# Patient Record
Sex: Male | Born: 1985 | Race: Black or African American | Hispanic: No | Marital: Single | Smoking: Never smoker
Health system: Southern US, Community
[De-identification: ages and names within clinical notes are randomized; demographics above are authoritative.]

---
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
## 2010-06-24 ENCOUNTER — Emergency Department (HOSPITAL_BASED_OUTPATIENT_CLINIC_OR_DEPARTMENT_OTHER)
Admission: EM | Admit: 2010-06-24 | Discharge: 2010-06-24 | Disposition: A | Discharge status: Home / Self Care | Payer: Self-pay | Attending: Emergency Medicine | Admitting: Emergency Medicine

## 2010-06-24 ENCOUNTER — Emergency Department (INDEPENDENT_AMBULATORY_CARE_PROVIDER_SITE_OTHER): Payer: Self-pay

## 2010-06-24 DIAGNOSIS — N1330 Unspecified hydronephrosis: Secondary | ICD-10-CM

## 2010-06-24 DIAGNOSIS — N201 Calculus of ureter: Secondary | ICD-10-CM | POA: Insufficient documentation

## 2010-06-24 DIAGNOSIS — R109 Unspecified abdominal pain: Secondary | ICD-10-CM | POA: Insufficient documentation

## 2010-06-24 LAB — URINALYSIS, ROUTINE W REFLEX MICROSCOPIC
Bilirubin Urine: NEGATIVE
Nitrite: NEGATIVE

## 2010-06-24 LAB — BASIC METABOLIC PANEL
BUN: 11 mg/dL (ref 6–23)
CO2: 25 mEq/L (ref 19–32)
Chloride: 103 mEq/L (ref 96–112)
Creatinine, Ser: 1 mg/dL (ref 0.4–1.5)
GFR calc Af Amer: >60 mL/min (ref >60)
Glucose, Bld: 89 mg/dL (ref 70–99)

## 2010-06-24 LAB — URINE MICROSCOPIC-ADD ON

## 2010-09-21 ENCOUNTER — Emergency Department (HOSPITAL_COMMUNITY)
Admission: EM | Admit: 2010-09-21 | Discharge: 2010-09-22 | Disposition: A | Discharge status: Home / Self Care | Payer: Self-pay | Attending: Emergency Medicine | Admitting: Emergency Medicine

## 2010-09-21 ENCOUNTER — Emergency Department (HOSPITAL_COMMUNITY): Payer: Self-pay

## 2010-09-21 DIAGNOSIS — W2209XA Striking against other stationary object, initial encounter: Secondary | ICD-10-CM | POA: Insufficient documentation

## 2010-09-21 DIAGNOSIS — S62319A Displaced fracture of base of unspecified metacarpal bone, initial encounter for closed fracture: Secondary | ICD-10-CM | POA: Insufficient documentation

## 2010-10-17 ENCOUNTER — Inpatient Hospital Stay (INDEPENDENT_AMBULATORY_CARE_PROVIDER_SITE_OTHER)
Admission: RE | Admit: 2010-10-17 | Discharge: 2010-10-17 | Disposition: A | Discharge status: Home / Self Care | Payer: Self-pay | Source: Ambulatory Visit | Attending: Family Medicine | Admitting: Family Medicine

## 2010-10-17 DIAGNOSIS — S62309A Unspecified fracture of unspecified metacarpal bone, initial encounter for closed fracture: Secondary | ICD-10-CM

## 2011-07-16 DIAGNOSIS — F1210 Cannabis abuse, uncomplicated: Secondary | ICD-10-CM | POA: Insufficient documentation

## 2011-07-16 DIAGNOSIS — R443 Hallucinations, unspecified: Secondary | ICD-10-CM | POA: Insufficient documentation

## 2011-07-17 ENCOUNTER — Emergency Department (HOSPITAL_COMMUNITY)
Admission: EM | Admit: 2011-07-17 | Discharge: 2011-07-17 | Disposition: A | Discharge status: Home / Self Care | Payer: Self-pay | Attending: Emergency Medicine | Admitting: Emergency Medicine

## 2011-07-17 ENCOUNTER — Encounter (HOSPITAL_COMMUNITY): Payer: Self-pay | Admitting: Emergency Medicine

## 2011-07-17 DIAGNOSIS — F1910 Other psychoactive substance abuse, uncomplicated: Secondary | ICD-10-CM

## 2011-07-17 NOTE — ED Notes (Signed)
Pt states he smoked some "fake weed" tonight and it made him feel bad  Pt states it made him hallucinate  Pt states he feels better now

## 2011-07-17 NOTE — ED Provider Notes (Signed)
History     CSN: [id number]  Arrival date & time 07/16/11  2341   First MD Initiated Contact with Patient 07/17/11 0327      No chief complaint on file.   (Consider location/radiation/quality/duration/timing/severity/associated sxs/prior treatment) HPI Pt presents to ED after smoking synthetic marijuana.  Pt started to feel poorly and hallucinate.  Those symptoms have all resolved.  NO chest pain.  No other complaints. No fevers or cough.  No head injury. History reviewed. No pertinent past medical history.  History reviewed. No pertinent past surgical history.  Family History  Problem Relation Age of Onset  . Diabetes Father     History  Substance Use Topics  . Smoking status: Never Smoker   . Smokeless tobacco: Not on file  . Alcohol Use: Yes     occassional      Review of Systems  All other systems reviewed and are negative.    Allergies  Review of patient's allergies indicates no known allergies.  Home Medications  No current outpatient prescriptions on file.  BP 124/70  Pulse 69  Temp(Src) 99.6 F (37.6 C) (Oral)  Resp 16  SpO2 99%  Physical Exam  Nursing note and vitals reviewed. Constitutional: He appears well-developed and well-nourished. No distress.  HENT:  Head: Normocephalic and atraumatic.  Right Ear: External ear normal.  Left Ear: External ear normal.  Eyes: Conjunctivae are normal. Right eye exhibits no discharge. Left eye exhibits no discharge. No scleral icterus.  Neck: Neck supple. No tracheal deviation present.  Cardiovascular: Normal rate, regular rhythm and intact distal pulses.   Pulmonary/Chest: Effort normal and breath sounds normal. No stridor. No respiratory distress. He has no wheezes. He has no rales.  Abdominal: Soft. Bowel sounds are normal. He exhibits no distension. There is no tenderness. There is no rebound and no guarding.  Musculoskeletal: He exhibits no edema and no tenderness.  Neurological: He is alert. He has  normal strength. No sensory deficit. Cranial nerve deficit:  no gross defecits noted. He exhibits normal muscle tone. He displays no seizure activity. Coordination normal.  Skin: Skin is warm and dry. No rash noted.  Psychiatric: He has a normal mood and affect.    ED Course  Procedures (including critical care time)  Labs Reviewed - No data to display No results found.   No diagnosis found.    MDM  Pt with symptoms following illicit drug use.  All resolved at  This time.  No sign of serious reaction.        Celene Kras, MD 07/17/11 [phone]

## 2011-07-17 NOTE — Discharge Instructions (Signed)
Drug Abuse, Frequently Asked Questions  Drug addiction is a complex brain disease. It is characterized by compulsive, at times uncontrollable, drug craving, seeking, and use that persists even in the face of extremely negative results. Drug seeking becomes compulsive, in large part as a result of the effects of prolonged drug use on brain functioning and, thus, on behavior. For many people, drug addiction becomes chronic, with relapses possible even after long periods of being off the drug.  HOW QUICKLY CAN I BECOME ADDICTED TO A DRUG?  There is no easy answer to this. If and how quickly you might become addicted to a drug depends on many factors including the biology of your body. All drugs are potentially harmful and may have life-threatening consequences associated with their use. There are also vast differences among individuals in sensitivity to various drugs. While one person may use a drug many times and suffer no ill effects, another person may be particularly vulnerable and overdose or developing a craving with the first use. There is no way of knowing in advance how someone may react.  HOW DO I KNOW IF SOMEONE IS ADDICTED TO DRUGS?  If a person is compulsively seeking and using a drug despite negative consequences (such as loss of job, debt, physical problems brought on by drug abuse, or family problems) then he or she is probably addicted. Those who screen for drug problems, such as physicians, have developed the CAGE questionnaire. These four simple questions can help detect substance abuse problems:   Have you ever felt you ought to Cut down on your drinking/drug use?   Have people ever Annoyed you by criticizing your drinking/drug use?   Have you ever felt bad or Guilty about your drinking/drug use?   Have you ever had a drink or taken a drug first thing in the morning to steady your nerves or get rid of a hangover (Eye-opener)?  WHAT ARE THE PHYSICAL SIGNS OF ABUSE OR ADDICTION?  The physical  signs of abuse or addiction can vary depending on the person and the drug being abused. For example, someone who abuses marijuana may have a chronic cough or worsening of asthmatic conditions. THC, the chemical in marijuana responsible for producing its effects, is associated with weakening the immune system which makes the user more vulnerable to infections, such as pneumonia. Each drug has short-term and long-term physical effects. Stimulants like cocaine increase heart rate and blood pressure, whereas opioids like heroin may slow the heart rate and reduce breathing (respiration).   ARE THERE EFFECTIVE TREATMENTS FOR DRUG ADDICTION?  Drug addiction can be effectively treated with behavioral-based therapies and, for addiction to some drugs such as heroin or nicotine, medications may be used. Treatment may vary for each person depending on the type of drug(s) being used and multiple courses of treatment may be needed to achieve success. Research has revealed 13 basic principles that underlie effective drug addiction treatment. These are discussed in NIDA's Principles of Drug Addiction Treatment: A Research-Based Guide.  WHERE CAN I FIND INFORMATION ABOUT DRUG TREATMENT PROGRAMS?   For referrals to treatment programs, visit the Substance Abuse and Mental Health Services Administration online at http://findtreatment.samhsa.gov/.   NIDA publishes an expanding series of treatment manuals, the "clinical toolbox," that gives drug treatment providers research-based information for creating effective treatment programs.  WHAT IS DETOXIFICATION, OR "DETOX"?  Detoxification is the process of allowing the body to rid itself of a drug while managing the symptoms of withdrawal. It is often the first   step in a drug treatment program and should be followed by treatment with a behavioral-based therapy and/or a medication, if available. Detox alone with no follow-up is not treatment.   WHAT IS WITHDRAWAL? HOW LONG DOES IT  LAST?  Withdrawal is the variety of symptoms that occur after use of some addictive drugs is reduced or stopped. Length of withdrawal and symptoms vary with the type of drug. For example, physical symptoms of heroin withdrawal may include restlessness, muscle and bone pain, insomnia, diarrhea, vomiting, and cold flashes. These physical symptoms may last for several days, but the general depression, or dysphoria (opposite of euphoria), that often accompanies heroin withdrawal, may last for weeks. In many cases withdrawal can be easily treated with medications to ease the symptoms. But treating withdrawal is not the same as treating addiction.   WHAT ARE THE COSTS OF DRUG ABUSE TO SOCIETY?  Beyond the raw numbers are other costs to society:   Spread of infectious diseases such as HIV/AIDS and hepatitis C either through sharing of drug paraphernalia or unprotected sex.   Deaths due to overdose or other complications from drug use.   Effects on unborn children of pregnant drug users.   Other effects such as crime and homelessness.  IF A PREGNANT WOMAN ABUSES DRUGS, DOES IT AFFECT THE FETUS?   Many substances including alcohol, nicotine, and drugs of abuse can have negative effects on the developing fetus because they are transferred to the fetus across the placenta. For example, nicotine has been connected with premature birth and low birth weight, as has the use of cocaine. Scientific studies have shown that babies born to marijuana users were shorter, weighed less, and had smaller head sizes than those born to mothers who did not use the drug. Smaller babies are more likely to develop health problems.   Whether a baby's health problems, if caused by a drug, will continue as the child grows, is not always known. Research does show that children born to mothers who used marijuana regularly during pregnancy may have trouble concentrating, when older. Our research continues to produce insights on the negative  effects of drug use on the fetus.  Document Released: 03/27/2003 Document Revised: 03/13/2011 Document Reviewed: 06/23/2008  [employer] Patient Information 2012 [employer].
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
## 2014-10-19 ENCOUNTER — Emergency Department (HOSPITAL_COMMUNITY)
Admission: EM | Admit: 2014-10-19 | Discharge: 2014-10-19 | Disposition: A | Discharge status: Home / Self Care | Payer: No Typology Code available for payment source | Attending: Emergency Medicine | Admitting: Emergency Medicine

## 2014-10-19 ENCOUNTER — Encounter (HOSPITAL_COMMUNITY): Payer: Self-pay | Admitting: Emergency Medicine

## 2014-10-19 DIAGNOSIS — Y998 Other external cause status: Secondary | ICD-10-CM | POA: Diagnosis not present

## 2014-10-19 DIAGNOSIS — S3992XA Unspecified injury of lower back, initial encounter: Secondary | ICD-10-CM | POA: Insufficient documentation

## 2014-10-19 DIAGNOSIS — M5489 Other dorsalgia: Secondary | ICD-10-CM

## 2014-10-19 DIAGNOSIS — Y92410 Unspecified street and highway as the place of occurrence of the external cause: Secondary | ICD-10-CM | POA: Insufficient documentation

## 2014-10-19 DIAGNOSIS — Y9389 Activity, other specified: Secondary | ICD-10-CM | POA: Diagnosis not present

## 2014-10-19 DIAGNOSIS — M62830 Muscle spasm of back: Secondary | ICD-10-CM | POA: Insufficient documentation

## 2014-10-19 DIAGNOSIS — S199XXA Unspecified injury of neck, initial encounter: Secondary | ICD-10-CM | POA: Diagnosis not present

## 2014-10-19 MED ORDER — CYCLOBENZAPRINE HCL 10 MG PO TABS: 10.0000 mg | ORAL_TABLET | Freq: Three times a day (TID) | ORAL | Status: DC | PRN

## 2014-10-19 MED ORDER — NAPROXEN 500 MG PO TABS
500.0000 mg | ORAL_TABLET | Freq: Once | ORAL | Status: AC
  Administered 2014-10-19: 500 mg via ORAL
  Filled 2014-10-19: qty 1

## 2014-10-19 MED ORDER — NAPROXEN 500 MG PO TABS: 500.0000 mg | ORAL_TABLET | Freq: Two times a day (BID) | ORAL | Status: DC | PRN
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
## 2015-06-25 ENCOUNTER — Emergency Department (HOSPITAL_COMMUNITY)
Admission: EM | Admit: 2015-06-25 | Discharge: 2015-06-25 | Disposition: A | Discharge status: Home / Self Care | Payer: No Typology Code available for payment source | Attending: Emergency Medicine | Admitting: Emergency Medicine

## 2015-06-25 ENCOUNTER — Encounter (HOSPITAL_COMMUNITY): Payer: Self-pay | Admitting: Emergency Medicine

## 2015-06-25 ENCOUNTER — Emergency Department (HOSPITAL_COMMUNITY): Payer: No Typology Code available for payment source

## 2015-06-25 DIAGNOSIS — Y998 Other external cause status: Secondary | ICD-10-CM | POA: Insufficient documentation

## 2015-06-25 DIAGNOSIS — S62309A Unspecified fracture of unspecified metacarpal bone, initial encounter for closed fracture: Secondary | ICD-10-CM

## 2015-06-25 DIAGNOSIS — S62396A Other fracture of fifth metacarpal bone, right hand, initial encounter for closed fracture: Secondary | ICD-10-CM | POA: Insufficient documentation

## 2015-06-25 DIAGNOSIS — Y9289 Other specified places as the place of occurrence of the external cause: Secondary | ICD-10-CM | POA: Insufficient documentation

## 2015-06-25 DIAGNOSIS — Y9389 Activity, other specified: Secondary | ICD-10-CM | POA: Insufficient documentation

## 2015-06-25 MED ORDER — HYDROCODONE-ACETAMINOPHEN 5-325 MG PO TABS: 1.0000 | ORAL_TABLET | ORAL | Status: DC | PRN

## 2015-06-25 MED ORDER — IBUPROFEN 800 MG PO TABS
800.0000 mg | ORAL_TABLET | Freq: Once | ORAL | Status: AC
  Administered 2015-06-25: 800 mg via ORAL
  Filled 2015-06-25: qty 1

## 2015-06-25 MED ORDER — IBUPROFEN 800 MG PO TABS: 800.0000 mg | ORAL_TABLET | Freq: Three times a day (TID) | ORAL | Status: DC

## 2015-06-25 NOTE — ED Notes (Signed)
Ortho at the bedside.
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
## 2015-06-25 NOTE — Discharge Instructions (Signed)
1. Medications: pain medicine, ibuprofen, usual home medications 2. Treatment: rest, drink plenty of fluids, ice, elevate, wear splint 3. Follow Up: please followup with the hand specialist (call this week to make appt) for discussion of your diagnoses and further evaluation after today's visit; please return to the ER for increased pain, numbness, new or worsening symptoms   Metacarpal Fracture Fractures of metacarpals are breaks in the bones of the hand. They extend from the knuckles to the wrist. These bones can break in many ways. There are different ways of treating these fractures. HOME CARE  Only exercise as told by your doctor.  Return to activities as told by your doctor.  Go to physical therapy as told by your doctor.  Follow your doctor's advice about driving.  Keep the injured hand raised (elevated) above the level of your heart.  If a plaster, fiberglass, or pre-formed splint was applied:  Wear your splint as told and until you are examined again.  Apply ice on the injury for 15-20 minutes at a time, 03-04 times a day. Put the ice in a plastic bag. Place a towel between your skin and the bag.  Do not get your splint or cast wet. Protect it during bathing with a plastic bag.  Loosen the elastic bandage around the splint if your fingers start to get numb, tingle, get cold, or turn blue.  If the splint is plaster, do not lean it on hard surfaces or put pressure on it for 24 hours after it is put on.  Do not  try to scratch the skin under the cast.  Check the skin around the cast every day. You may put lotion on red or sore areas.  Move the fingers of your casted hand several times a day.  Only take medicine as told by your doctor.  Follow up as told by your doctor. This is very important in order to avoid permanent injury, disability, or lasting (chronic) pain. GET HELP RIGHT AWAY IF:   You develop a rash.  You have problems breathing.  You have any allergy  problems.  You have more than a small spot of blood from beneath your cast or splint.  You have redness, puffiness (swelling), or more pain from beneath your cast or splint.  Yellowish-white fluid (pus) comes from beneath your cast or splint.  You develop a temperature by mouth above 102 F (38.9 C), not controlled by medicine.  You have a bad smell coming from under your cast or splint.  You have problems moving any of your fingers. If you do not have a window in your cast for looking at the wound, a fluid or a little bleeding may show up as a stain on the outside of your cast. Tell your doctor about any stains you see. MAKE SURE YOU:   Understand these instructions.  Will watch your condition.  Will get help right away if you are not doing well or get worse.   This information is not intended to replace advice given to you by your health care provider. Make sure you discuss any questions you have with your health care provider.   Document Released: 09/10/2007 Document Revised: 04/14/2014 Document Reviewed: 01/11/2014 Elsevier Interactive Patient Education [employer]2016 Elsevier Inc.
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
## 2022-03-10 ENCOUNTER — Ambulatory Visit (HOSPITAL_COMMUNITY)
Admission: EM | Admit: 2022-03-10 | Discharge: 2022-03-10 | Disposition: A | Discharge status: Home / Self Care | Payer: No Payment, Other | Attending: Psychiatry | Admitting: Psychiatry

## 2022-03-10 DIAGNOSIS — F4310 Post-traumatic stress disorder, unspecified: Secondary | ICD-10-CM | POA: Diagnosis not present

## 2022-03-10 DIAGNOSIS — F3181 Bipolar II disorder: Secondary | ICD-10-CM

## 2022-03-10 DIAGNOSIS — F1021 Alcohol dependence, in remission: Secondary | ICD-10-CM

## 2022-03-10 DIAGNOSIS — F411 Generalized anxiety disorder: Secondary | ICD-10-CM | POA: Diagnosis not present

## 2022-03-10 DIAGNOSIS — Z79899 Other long term (current) drug therapy: Secondary | ICD-10-CM | POA: Insufficient documentation

## 2022-03-10 LAB — CBC WITH DIFFERENTIAL/PLATELET
Abs Immature Granulocytes: 0 10*3/uL (ref 0.00–0.07)
Basophils Absolute: 0 10*3/uL (ref 0.0–0.1)
Basophils Relative: 1 %
Eosinophils Absolute: 0 10*3/uL (ref 0.0–0.5)
Eosinophils Relative: 1 %
HCT: 45.4 % (ref 39.0–52.0)
Hemoglobin: 16 g/dL (ref 13.0–17.0)
Immature Granulocytes: 0 %
Lymphocytes Relative: 25 %
Lymphs Abs: 1.4 10*3/uL (ref 0.7–4.0)
MCH: 32.5 pg (ref 26.0–34.0)
MCHC: 35.2 g/dL (ref 30.0–36.0)
MCV: 92.3 fL (ref 80.0–100.0)
Monocytes Absolute: 0.5 10*3/uL (ref 0.1–1.0)
Monocytes Relative: 8 %
Neutro Abs: 3.8 10*3/uL (ref 1.7–7.7)
Neutrophils Relative %: 65 %
Platelets: 145 10*3/uL — ABNORMAL LOW (ref 150–400)
RBC: 4.92 MIL/uL (ref 4.22–5.81)
RDW: 12.7 % (ref 11.5–15.5)
WBC: 5.8 10*3/uL (ref 4.0–10.5)
nRBC: 0 % (ref 0.0–0.2)

## 2022-03-10 LAB — COMPREHENSIVE METABOLIC PANEL
ALT: 64 U/L — ABNORMAL HIGH (ref 0–44)
AST: 94 U/L — ABNORMAL HIGH (ref 15–41)
Albumin: 4.7 g/dL (ref 3.5–5.0)
Alkaline Phosphatase: 86 U/L (ref 38–126)
Anion gap: 9 (ref 5–15)
BUN: 6 mg/dL (ref 6–20)
CO2: 27 mmol/L (ref 22–32)
Calcium: 9.9 mg/dL (ref 8.9–10.3)
Chloride: 102 mmol/L (ref 98–111)
Creatinine, Ser: 0.86 mg/dL (ref 0.61–1.24)
GFR, Estimated: >60 mL/min (ref >60)
Glucose, Bld: 96 mg/dL (ref 70–99)
Potassium: 3.5 mmol/L (ref 3.5–5.1)
Sodium: 138 mmol/L (ref 135–145)
Total Bilirubin: 1.6 mg/dL — ABNORMAL HIGH (ref 0.3–1.2)
Total Protein: 7.8 g/dL (ref 6.5–8.1)

## 2022-03-10 MED ORDER — BUSPIRONE HCL 5 MG PO TABS: 5.0000 mg | ORAL_TABLET | Freq: Two times a day (BID) | ORAL | 0 refills | Status: AC

## 2022-03-10 NOTE — Plan of Care (Signed)
Attempted to call patient x2 about elevated AST/ALT and thrombocytopenia but went VM. Labs are relatively consistent with alcoholic hepatitis.   Left HIPAA compliant VM.
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
## 2022-03-10 NOTE — Discharge Instructions (Addendum)
[facility] [address], [place], [address] [phone] phone   New Patient Assessment/Therapy Walk-Ins:  Monday and Wednesday: 8 am until slots are full. Every 1st and 2nd Fridays of the month: 1 pm - 5 pm.  NO ASSESSMENT/THERAPY WALK-INS ON TUESDAYS OR THURSDAYS  New Patient Assessment/Medication Management Walk-Ins:  Monday - Friday:  8 am - 11 am.  For all walk-ins, we ask that you arrive by 7:30 am because patients will be seen in the order of arrival.  Availability is limited; therefore, you may not be seen on the same day that you walk-in.  Our goal is to serve and meet the needs of our community to the best of our ability.   SUBSTANCE USE TREATMENT for Medicaid and State Funded/IPRS  [facility] [address], [place], [address] [phone] phone NOTE: [facility] is no longer offering IOP services.  Serves those who are low-income or have no insurance.  [facility] [address], [place], [place], [address] [phone] phone [phone] fax NOTE: Does have Substance Abuse-Intensive Outpatient Program [facility]) as well as transitional housing if eligible.  [facility] [address] [place], [place], [address] [phone] phone [phone] fax  [facility] [phone] [place], [place], [address] [phone] phone [phone] fax   CHARITABLE RESIDENTIAL REHABS  [facility] (men only at this campus) [address] [place], [place] [address] [phone]  ((These programs listed above have a one-time application fee.))   [facility] [address] [place] [address] [phone] [address] [place] [address] [place], [place] [address] [phone], [place] [address] [phone]  [facility] (Rehab for men only) [address], [place], [place]  [address] [phone]   MEDICAL DETOX/RESIDENTIAL TREATMENT- MEDICAID/IPRS:  [facility] [address] [place], [place] [address] [phone]  [facility] [address] [place], [place] [address] [phone]  [facility] Recovery Services [address] [place], [place] [address] [phone]  [facility] [address] [place], [place] [address] [phone]  [facility] Services [address] [place], [place] [address] [phone]  ((For admissions to these three [facility] facilities during weekday days and possibly other times, contact [place], phone: [phone]; fax: [phone]))  [facility] (detox for men and women) [address] [place], [place] [address] [phone]  OUTPATIENT PROGRAMS:  [facility] ([facility]) [address], [place] [address] [phone]  ((CD-IOP is currently not operational; Opioid replacement clinic is operational))   [facility] (Medicaid & IPRS for [facility] residents) [address] [place], [place] [address] [phone]  ((CD-IOP; new clients must go through walk-in clinic))   [facility] [employer] ([place] for [employer] and [employer]; some private insurance) [address] [place], [place] [address] [phone]  ((CD-IOP))   [facility] & private insurance) [phone] [place], [place] [address] [phone]  ((CD-IOP (morning & evening programs); individual therapy))   HALFWAY HOUSES:  [employer] [phone]  [email or web address]   OPIOID REPLACEMENT PROVIDERS:  [facility]) [address], [place] [address] [phone]  [facility] [address] [place], [place] [address] [phone]  [facility] [address] [place], [place] [address] [phone]   12 STEP PROGRAMS:  Alcoholics Anonymous of [place] [employer]  Narcotics Anonymous of [place] [employer].dk  Al-Anon of [place], [place] [email or web address]/find-meetings.html  Nar-Anon [email or web address]-[email or web address]
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
## 2022-11-23 ENCOUNTER — Encounter (HOSPITAL_COMMUNITY): Payer: Self-pay

## 2022-11-23 ENCOUNTER — Emergency Department (HOSPITAL_COMMUNITY)
Admission: EM | Admit: 2022-11-23 | Discharge: 2022-11-23 | Disposition: A | Discharge status: Home / Self Care | Payer: Self-pay | Attending: Emergency Medicine | Admitting: Emergency Medicine

## 2022-11-23 DIAGNOSIS — K2920 Alcoholic gastritis without bleeding: Secondary | ICD-10-CM | POA: Insufficient documentation

## 2022-11-23 DIAGNOSIS — R7401 Elevation of levels of liver transaminase levels: Secondary | ICD-10-CM | POA: Insufficient documentation

## 2022-11-23 LAB — CBC
HCT: 49.6 % (ref 39.0–52.0)
Hemoglobin: 17.7 g/dL — ABNORMAL HIGH (ref 13.0–17.0)
MCH: 33.2 pg (ref 26.0–34.0)
MCHC: 35.7 g/dL (ref 30.0–36.0)
MCV: 93.1 fL (ref 80.0–100.0)
Platelets: 158 10*3/uL (ref 150–400)
RBC: 5.33 MIL/uL (ref 4.22–5.81)
RDW: 12.5 % (ref 11.5–15.5)
WBC: 4.9 10*3/uL (ref 4.0–10.5)
nRBC: 0 % (ref 0.0–0.2)

## 2022-11-23 LAB — COMPREHENSIVE METABOLIC PANEL
ALT: 388 U/L — ABNORMAL HIGH (ref 0–44)
AST: 542 U/L — ABNORMAL HIGH (ref 15–41)
Albumin: 5 g/dL (ref 3.5–5.0)
Alkaline Phosphatase: 85 U/L (ref 38–126)
Anion gap: 9 (ref 5–15)
BUN: 10 mg/dL (ref 6–20)
CO2: 28 mmol/L (ref 22–32)
Calcium: 10 mg/dL (ref 8.9–10.3)
Chloride: 99 mmol/L (ref 98–111)
Creatinine, Ser: 1.04 mg/dL (ref 0.61–1.24)
GFR, Estimated: >60 mL/min (ref >60)
Glucose, Bld: 104 mg/dL — ABNORMAL HIGH (ref 70–99)
Potassium: 4.3 mmol/L (ref 3.5–5.1)
Sodium: 136 mmol/L (ref 135–145)
Total Bilirubin: 1.1 mg/dL (ref 0.3–1.2)
Total Protein: 8.7 g/dL — ABNORMAL HIGH (ref 6.5–8.1)

## 2022-11-23 LAB — URINALYSIS, ROUTINE W REFLEX MICROSCOPIC
Bilirubin Urine: NEGATIVE
Glucose, UA: NEGATIVE mg/dL
Hgb urine dipstick: NEGATIVE
Ketones, ur: NEGATIVE mg/dL
Leukocytes,Ua: NEGATIVE
Nitrite: NEGATIVE
Protein, ur: NEGATIVE mg/dL
Specific Gravity, Urine: 1.013 (ref 1.005–1.030)
pH: 7 (ref 5.0–8.0)

## 2022-11-23 LAB — LIPASE, BLOOD: Lipase: 39 U/L (ref 11–51)

## 2022-11-23 MED ORDER — DIAZEPAM 5 MG PO TABS
5.0000 mg | ORAL_TABLET | Freq: Once | ORAL | Status: AC
  Administered 2022-11-23: 5 mg via ORAL
  Filled 2022-11-23: qty 1

## 2022-11-23 MED ORDER — NALTREXONE HCL 50 MG PO TABS: 50.0000 mg | ORAL_TABLET | Freq: Every day | ORAL | 0 refills | Status: AC

## 2022-11-23 MED ORDER — FAMOTIDINE 40 MG PO TABS: 40.0000 mg | ORAL_TABLET | Freq: Every day | ORAL | 0 refills | Status: AC

## 2022-11-23 MED ORDER — CLONIDINE HCL 0.2 MG PO TABS: 0.2000 mg | ORAL_TABLET | Freq: Every day | ORAL | 0 refills | Status: DC

## 2022-11-23 MED ORDER — FAMOTIDINE 20 MG PO TABS
40.0000 mg | ORAL_TABLET | Freq: Once | ORAL | Status: AC
  Administered 2022-11-23: 40 mg via ORAL
  Filled 2022-11-23: qty 2

## 2022-11-23 NOTE — ED Provider Notes (Signed)
[place] EMERGENCY DEPARTMENT AT [facility] Provider Note   CSN: [id number] Arrival date & time: 11/23/22  1722     History  Chief Complaint  Patient presents with   Abdominal Pain    Ronald Nichols is a 37 y.o. male.  This is a 37 year old male complaining of epigastric pain.  Patient with a heavy history of alcohol abuse, dating back to when he turned 23.  He says that over the last year, he began to drink more heavily.  He says that few months ago, he stopped drinking hard liquor, and over the last couple of months he has begun to decrease his beer consumption.  He last had a drink of alcohol 3 days ago.  He has had withdrawal in the past, does not feel as though he is withdrawing now.  He has been able to eat and drink.  He has had some epigastric discomfort that has been intermittent.  He endorses at times feeling somewhat shaky.   Abdominal Pain      Home Medications Prior to Admission medications   Medication Sig Start Date End Date Taking? Authorizing Provider  cloNIDine (CATAPRES) 0.2 MG tablet Take 1 tablet (0.2 mg total) by mouth daily. 11/23/22 12/23/22 Yes Anders Simmonds T, DO  naltrexone (DEPADE) 50 MG tablet Take 1 tablet (50 mg total) by mouth daily. 11/23/22 12/23/22 Yes Anders Simmonds T, DO      Allergies    Patient has no known allergies.    Review of Systems   Review of Systems  Gastrointestinal:  Positive for abdominal pain.    Physical Exam Updated Vital Signs BP (!) 152/110 (BP Location: Left Arm)   Pulse 84   Temp 99.2 F (37.3 C) (Oral)   Resp 20   SpO2 100%  Physical Exam Vitals reviewed.  HENT:     Head: Normocephalic and atraumatic.  Abdominal:     General: There is no distension.     Palpations: Abdomen is soft. There is no shifting dullness or fluid wave.     Tenderness: There is no abdominal tenderness.  Neurological:     Mental Status: He is alert.     ED Results / Procedures / Treatments   Labs (all labs ordered  are listed, but only abnormal results are displayed) Labs Reviewed  COMPREHENSIVE METABOLIC PANEL - Abnormal; Notable for the following components:      Result Value   Glucose, Bld 104 (*)    Total Protein 8.7 (*)    AST 542 (*)    ALT 388 (*)    All other components within normal limits  CBC - Abnormal; Notable for the following components:   Hemoglobin 17.7 (*)    All other components within normal limits  LIPASE, BLOOD  URINALYSIS, ROUTINE W REFLEX MICROSCOPIC    EKG None  Radiology No results found.  Procedures Procedures    Medications Ordered in ED Medications  diazepam (VALIUM) tablet 5 mg (5 mg Oral Given 11/23/22 2119)    ED Course/ Medical Decision Making/ A&P                                 Medical Decision Making This is a 37 year old male who is here today for epigastric pain.  Differential diagnoses include alcoholic gastritis, transaminitis, cirrhosis, pancreatitis, less likely acute intra-abdominal infection, less likely cholecystitis.  Plan-with the patient's history and symptoms, believe this is alcoholic gastritis.  Patient had labs drawn at triage, do show transaminitis.  In the setting of his alcohol use, does make sense of the patient's AST is significantly elevated compared to ALT.  Patient eating and drinking, so I would not be surprised that this lipase was bit elevated, does not appear to be clinically relevant.  Patient not showing symptoms of withdrawal.  Patient is very forward-looking, motivated to quit drinking.  He states that he wants to quit again because he knows it is bad for his health, because he has children.  I considered obtaining imaging on this patient, however believe that this would be appropriate for an outpatient workup.  Will discharge patient with naltrexone, clonidine.  Will refer the patient to the [facility] community clinic.  Amount and/or Complexity of Data Reviewed Labs: ordered.  Risk Prescription drug  management.           Final Clinical Impression(s) / ED Diagnoses Final diagnoses:  Chronic alcoholic gastritis without hemorrhage    Rx / DC Orders ED Discharge Orders          Ordered    naltrexone (DEPADE) 50 MG tablet  Daily        11/23/22 2131    cloNIDine (CATAPRES) 0.2 MG tablet  Daily        11/23/22 2131              Anders Simmonds T, DO 11/23/22 2133

## 2022-11-23 NOTE — Discharge Instructions (Addendum)
You can take naltrexone and clonidine each day for the next 1 month.  These medications can help with some of the symptoms that you are describing.  You can take famotidine each day to help out with the abdominal discomfort.  Please call the [place] [facility] tomorrow to set up a follow-up appointment.  They will be able to help do additional testing, and get you set up with a primary care physician.  Congratulations on deciding to cut back on your drinking. Keep it up!

## 2022-11-23 NOTE — ED Triage Notes (Signed)
Pt arrived via POV , c/o abd discomfort for a couple days. States he is daily drinker approx 8 beers a day, and has not had any x3 days.

## 2022-12-10 ENCOUNTER — Other Ambulatory Visit (HOSPITAL_COMMUNITY): Payer: Self-pay

## 2022-12-10 ENCOUNTER — Encounter (INDEPENDENT_AMBULATORY_CARE_PROVIDER_SITE_OTHER): Payer: Self-pay | Admitting: Primary Care

## 2022-12-10 ENCOUNTER — Ambulatory Visit (INDEPENDENT_AMBULATORY_CARE_PROVIDER_SITE_OTHER): Payer: Self-pay | Admitting: Primary Care

## 2022-12-10 VITALS — BP 140/84 | HR 80 | Resp 16 | Ht 73.0 in | Wt 170.8 lb

## 2022-12-10 DIAGNOSIS — I10 Essential (primary) hypertension: Secondary | ICD-10-CM

## 2022-12-10 DIAGNOSIS — Z7689 Persons encountering health services in other specified circumstances: Secondary | ICD-10-CM

## 2022-12-10 DIAGNOSIS — F4310 Post-traumatic stress disorder, unspecified: Secondary | ICD-10-CM

## 2022-12-10 DIAGNOSIS — F3181 Bipolar II disorder: Secondary | ICD-10-CM

## 2022-12-10 DIAGNOSIS — Z23 Encounter for immunization: Secondary | ICD-10-CM

## 2022-12-10 DIAGNOSIS — Z1159 Encounter for screening for other viral diseases: Secondary | ICD-10-CM

## 2022-12-10 DIAGNOSIS — F1021 Alcohol dependence, in remission: Secondary | ICD-10-CM

## 2022-12-10 MED ORDER — AMLODIPINE BESYLATE 10 MG PO TABS
10.0000 mg | ORAL_TABLET | Freq: Every day | ORAL | 1 refills | Status: AC
Start: 2022-12-10 — End: ?
  Filled 2022-12-10: qty 90, 90d supply, fill #0
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
## 2022-12-10 NOTE — Patient Instructions (Addendum)
Gastritis, Adult Gastritis is inflammation of the stomach. There are two kinds of gastritis: Acute gastritis. This kind develops suddenly. Chronic gastritis. This kind is much more common. It develops slowly and lasts for a long time. Gastritis happens when the lining of the stomach becomes weak or gets damaged. Without treatment, gastritis can lead to stomach bleeding and ulcers. What are the causes? This condition may be caused by: An infection. Drinking too much alcohol. Certain medicines. These include steroids, antibiotics, and some over-the-counter medicines, such as aspirin or ibuprofen. Having too much acid in the stomach. Having a disease of the stomach. Other causes may include: An allergic reaction. Some cancer treatments (radiation). Smoking cigarettes or the use of products that contain nicotine or tobacco. In some cases, the cause of this condition is not known. What increases the risk? Having a disease of the intestines. Having a disease in which the body's immune system attacks the body (autoimmune disease), such as Crohn's disease. Using aspirin or ibuprofen and other NSAIDs to treat other conditions, such as heart disease or chronic pain. Stress. What are the signs or symptoms? Symptoms of this condition include: Pain or a burning sensation in the upper abdomen. Nausea. Vomiting. An uncomfortable feeling of fullness after eating. Weight loss. Bad breath. Blood in your vomit or stool (feces). In some cases, there are no symptoms. How is this diagnosed? This condition may be diagnosed based on your medical history, a physical exam, and tests. Tests may include: Your medical history and a description of your symptoms. A physical exam. Tests. These can include: Blood tests. Stool tests. A test in which a thin, flexible instrument with a light and a camera is passed down the esophagus and into the stomach (upper endoscopy). A test in which a tissue sample is  removed to look at it under a microscope (biopsy). How is this treated? This condition may be treated with medicines. The medicines that are used vary depending on the cause of the gastritis. If the condition is caused by a bacterial infection, you may be given antibiotic medicines. If the condition is caused by too much acid in the stomach, you may be given medicines called H2 blockers, proton pump inhibitors, or antacids. Treatment may also involve stopping the use of certain medicines such as aspirin or ibuprofen and other NSAIDs. Follow these instructions at home: Medicines Take over-the-counter and prescription medicines only as told by your health care provider. If you were prescribed an antibiotic medicine, take it as told by your health care provider. Do not stop taking the antibiotic even if you start to feel better. Alcohol use Do not drink alcohol if: Your health care provider tells you not to drink. You are pregnant, may be pregnant, or are planning to become pregnant. If you drink alcohol: Limit your use to: 0-1 drink a day for women. 0-2 drinks a day for men. Know how much alcohol is in your drink. In the [place] one drink equals one 12 oz bottle of beer (355 mL), one 5 oz glass of wine (148 mL), or one 1 oz glass of hard liquor (44 mL). General instructions  Eat small, frequent meals instead of large meals. Avoid foods and drinks that make your symptoms worse. Talk with your health care provider about ways to manage stress, such as getting regular exercise or practicing deep breathing, meditation, or yoga. Do not use any products that contain nicotine or tobacco. These products include cigarettes, chewing tobacco, and vaping devices, such as e-cigarettes.  If you need help quitting, ask your health care provider. Drink enough fluid to keep your urine pale yellow. Keep all follow-up visits. This is important. Contact a health care provider if: Your symptoms get worse. Your  abdominal pain gets worse. Your symptoms return after treatment. You have a fever. Get help right away if: You vomit blood or a substance that looks like coffee grounds. You have black or dark red stools. You are unable to keep fluids down. These symptoms may represent a serious problem that is an emergency. Do not wait to see if the symptoms will go away. Get medical help right away. Call your local emergency services (911 in the [place] Do not drive yourself to the hospital. Summary Gastritis is inflammation of the lining of the stomach that can occur suddenly (acute) or develop slowly over time (chronic). This condition is diagnosed with a medical history, a physical exam, or tests. This condition may be treated with medicines to treat infection or medicines to reduce the amount of acid in your stomach. Follow your health care provider's instructions about taking medicines, making changes to your diet, and knowing when to call for help. This information is not intended to replace advice given to you by your health care provider. Make sure you discuss any questions you have with your health care provider. Document Revised: 07/28/2020 Document Reviewed: 07/28/2020 Elsevier Patient Education  2024 [employer] Amlodipine Tablets What is this medication? AMLODIPINE (am LOE di peen) treats high blood pressure and prevents chest pain (angina). It works by relaxing the blood vessels, which helps decrease the amount of work your heart has to do. It belongs to a group of medications called calcium channel blockers. This medicine may be used for other purposes; ask your health care provider or pharmacist if you have questions. COMMON BRAND NAME(S): Norvasc What should I tell my care team before I take this medication? They need to know if you have any of these conditions: Heart disease Liver disease An unusual or allergic reaction to amlodipine, other medications, foods, dyes, or  preservatives Pregnant or trying to get pregnant Breastfeeding How should I use this medication? Take this medication by mouth. Take it as directed on the prescription label at the same time every day. You can take it with or without food. If it upsets your stomach, take it with food. Keep taking it unless your care team tells you to stop. Talk to your care team about the use of this medication in children. While it may be prescribed for children as young as 6 for selected conditions, precautions do apply. Overdosage: If you think you have taken too much of this medicine contact a poison control center or emergency room at once. NOTE: This medicine is only for you. Do not share this medicine with others. What if I miss a dose? If you miss a dose, take it as soon as you can. If it is almost time for your next dose, take only that dose. Do not take double or extra doses. What may interact with this medication? Clarithromycin Cyclosporine Diltiazem Itraconazole Simvastatin Tacrolimus This list may not describe all possible interactions. Give your health care provider a list of all the medicines, herbs, non-prescription drugs, or dietary supplements you use. Also tell them if you smoke, drink alcohol, or use illegal drugs. Some items may interact with your medicine. What should I watch for while using this medication? Visit your care team for regular checks on your progress. Check your blood  pressure as directed. Know what your blood pressure should be and when to contact your care team. Do not treat yourself for coughs, colds, or pain while you are using this medication without asking your care team for advice. Some medications may increase your blood pressure. This medication may affect your coordination, reaction time, or judgment. Do not drive or operate machinery until you know how this medication affects you. Sit up or stand slowly to reduce the risk of dizzy or fainting spells. Drinking  alcohol with this medication can increase the risk of these side effects. What side effects may I notice from receiving this medication? Side effects that you should report to your care team as soon as possible: Allergic reactions--skin rash, itching, hives, swelling of the face, lips, tongue, or throat Heart attack--pain or tightness in the chest, shoulders, arms, or jaw, nausea, shortness of breath, cold or clammy skin, feeling faint or lightheaded Low blood pressure--dizziness, feeling faint or lightheaded, blurry vision Worsening chest pain (angina)--pain, pressure, or tightness in the chest, neck, back, or arms Side effects that usually do not require medical attention (report these to your care team if they continue or are bothersome): Facial flushing, redness Heart palpitations--rapid, pounding, or irregular heartbeat Nausea Stomach pain Swelling of the ankles, hands, or feet This list may not describe all possible side effects. Call your doctor for medical advice about side effects. You may report side effects to FDA at 1-800-FDA-1088. Where should I keep my medication? Keep out of the reach of children and pets. Store at room temperature between 20 and 25 degrees C (68 and 77 degrees F). Protect from light and moisture. Keep the container tightly closed. Get rid of any unused medication after the expiration date. To get rid of medications that are no longer needed or have expired: Take the medication to a medication take-back program. Check with your pharmacy or law enforcement to find a location. If you cannot return the medication, check the label or package insert to see if the medication should be thrown out in the garbage or flushed down the toilet. If you are not sure, ask your care team. If it is safe to put in the trash, empty the medication out of the container. Mix the medication with cat litter, dirt, coffee grounds, or other unwanted substance. Seal the mixture in a bag or  container. Put it in the trash. NOTE: This sheet is a summary. It may not cover all possible information. If you have questions about this medicine, talk to your doctor, pharmacist, or health care provider.  2024 Elsevier/Gold Standard (2021-10-14 00:00:00)

## 2022-12-11 ENCOUNTER — Telehealth (INDEPENDENT_AMBULATORY_CARE_PROVIDER_SITE_OTHER): Payer: Self-pay | Admitting: Licensed Clinical Social Worker

## 2022-12-11 NOTE — Telephone Encounter (Signed)
LCSWA called patient today to introduce herself and to assess patients' mental health needs. Patient did not answer the phone. LCSWA was able to leave a brief message with the patient asking them to return the call. Patient was referred by PCP for bipolar.

## 2022-12-22 ENCOUNTER — Other Ambulatory Visit (HOSPITAL_COMMUNITY): Payer: Self-pay

## 2022-12-31 ENCOUNTER — Telehealth: Payer: Self-pay | Admitting: Licensed Clinical Social Worker

## 2022-12-31 NOTE — Telephone Encounter (Signed)
Patient was called several times.  Left patient a message to have him call [facility] to speak with clinical therapist.
